# Patient Record
Sex: Male | Born: 1990
Health system: Southern US, Community
[De-identification: ages and names within clinical notes are randomized; demographics above are authoritative.]

## PROBLEM LIST (undated history)

## (undated) DIAGNOSIS — E119 Type 2 diabetes mellitus without complications: Secondary | ICD-10-CM

## (undated) DIAGNOSIS — K219 Gastro-esophageal reflux disease without esophagitis: Secondary | ICD-10-CM

## (undated) DIAGNOSIS — Z9889 Other specified postprocedural states: Secondary | ICD-10-CM

## (undated) DIAGNOSIS — Z87442 Personal history of urinary calculi: Secondary | ICD-10-CM

## (undated) DIAGNOSIS — R112 Nausea with vomiting, unspecified: Secondary | ICD-10-CM

## (undated) DIAGNOSIS — J45909 Unspecified asthma, uncomplicated: Secondary | ICD-10-CM

## (undated) HISTORY — PX: APPENDECTOMY: SHX54

---

## 2013-10-10 ENCOUNTER — Other Ambulatory Visit (HOSPITAL_COMMUNITY)
Admission: RE | Admit: 2013-10-10 | Discharge: 2013-10-10 | Disposition: A | Discharge status: Home / Self Care | Payer: BC Managed Care – PPO | Source: Ambulatory Visit | Attending: Family Medicine | Admitting: Family Medicine

## 2013-10-10 ENCOUNTER — Encounter (HOSPITAL_COMMUNITY): Payer: Self-pay | Admitting: Emergency Medicine

## 2013-10-10 ENCOUNTER — Emergency Department (INDEPENDENT_AMBULATORY_CARE_PROVIDER_SITE_OTHER)
Admission: EM | Admit: 2013-10-10 | Discharge: 2013-10-10 | Disposition: A | Discharge status: Home / Self Care | Payer: BC Managed Care – PPO | Source: Home / Self Care | Attending: Family Medicine | Admitting: Family Medicine

## 2013-10-10 DIAGNOSIS — Z113 Encounter for screening for infections with a predominantly sexual mode of transmission: Secondary | ICD-10-CM | POA: Insufficient documentation

## 2013-10-10 DIAGNOSIS — N3090 Cystitis, unspecified without hematuria: Secondary | ICD-10-CM

## 2013-10-10 DIAGNOSIS — N342 Other urethritis: Secondary | ICD-10-CM

## 2013-10-10 HISTORY — DX: Unspecified asthma, uncomplicated: J45.909

## 2013-10-10 LAB — URINE MICROSCOPIC-ADD ON

## 2013-10-10 LAB — URINALYSIS, ROUTINE W REFLEX MICROSCOPIC
Bilirubin Urine: NEGATIVE
GLUCOSE, UA: NEGATIVE mg/dL
KETONES UR: NEGATIVE mg/dL
Nitrite: NEGATIVE
PH: 6.5 (ref 5.0–8.0)
Protein, ur: 30 mg/dL — AB
Specific Gravity, Urine: 1.03 (ref 1.005–1.030)
Urobilinogen, UA: 1 mg/dL (ref 0.0–1.0)

## 2013-10-10 LAB — POCT URINALYSIS DIP (DEVICE)
BILIRUBIN URINE: NEGATIVE
Glucose, UA: NEGATIVE mg/dL
Ketones, ur: NEGATIVE mg/dL
Leukocytes, UA: NEGATIVE
NITRITE: NEGATIVE
PH: 6.5 (ref 5.0–8.0)
Protein, ur: 30 mg/dL — AB
Specific Gravity, Urine: 1.025 (ref 1.005–1.030)
Urobilinogen, UA: 0.2 mg/dL (ref 0.0–1.0)

## 2013-10-10 MED ORDER — CIPROFLOXACIN HCL 500 MG PO TABS: 500.0000 mg | ORAL_TABLET | Freq: Two times a day (BID) | ORAL | Status: DC

## 2013-10-10 MED ORDER — TRAMADOL HCL 50 MG PO TABS: 50.0000 mg | ORAL_TABLET | Freq: Four times a day (QID) | ORAL | Status: DC | PRN

## 2013-10-10 MED ORDER — ACETAMINOPHEN 325 MG PO TABS
ORAL_TABLET | ORAL | Status: AC
  Filled 2013-10-10: qty 3

## 2013-10-10 MED ORDER — ACETAMINOPHEN 500 MG PO TABS
1000.0000 mg | ORAL_TABLET | Freq: Once | ORAL | Status: AC
  Administered 2013-10-10: 1000 mg via ORAL

## 2013-10-10 NOTE — Discharge Instructions (Signed)
Urethritis, Adult  Urethritis is an inflammation of the tube through which urine exits your bladder (urethra).   CAUSES  Urethritis is often caused by an infection in your urethra. The infection can be viral, like herpes. The infection can also be bacterial, like gonorrhea.  RISK FACTORS  Risk factors of urethritis include:  · Having sex without using a condom.  · Having multiple sexual partners.  · Having poor hygiene.  SIGNS AND SYMPTOMS  Symptoms of urethritis are less noticeable in women than in men. These symptoms include:  · Burning feeling when you urinate (dysuria).  · Discharge from your urethra.  · Blood in your urine (hematuria).  · Urinating more than usual.  DIAGNOSIS   To confirm a diagnosis of urethritis, your health care provider will do the following:  · Ask about your sexual history.  · Perform a physical exam.  · Have you provide a sample of your urine for lab testing.  · Use a cotton swab to gently collect a sample from your urethra for lab testing.  TREATMENT   It is important to treat urethritis. Depending on the cause, untreated urethritis may lead to serious genital infections and possibly infertility. Urethritis caused by a bacterial infection is treated with antibiotics. All sexual partners must be treated.   HOME CARE INSTRUCTIONS  · Do not have sex until the test results are known and treatment is completed, even if your symptoms go away before you finish treatment.  · Finish all medicines that you are prescribed.  SEEK MEDICAL CARE IF:   · Your symptoms are not improved in 3 days.  · Your symptoms are getting worse.  · You develop abdominal pain or pelvic pain (in women).  · You develop joint pain.  SEEK IMMEDIATE MEDICAL CARE IF:   · You have a fever with a temperature of 101.8°F (38.8°C) or greater.  · You have severe pain in the belly, back, or side.  · You have repeated vomiting.  Document Released: 01/08/2001 Document Revised: 05/05/2013 Document Reviewed: 03/15/2013  ExitCare®  Patient Information ©2014 [employer].

## 2013-10-10 NOTE — ED Notes (Signed)
C/o  Dark colored urine.  Pain in bladder.  Sudden on set this a.m.  States"I have been drinking a lot of tea over the past week".

## 2013-10-10 NOTE — ED Provider Notes (Signed)
Medical screening examination/treatment/procedure(s) were performed by resident physician or non-physician practitioner and as supervising physician I was immediately available for consultation/collaboration.   KINDL,JAMES DOUGLAS MD.   James D Kindl, MD 10/10/13 1454 

## 2013-10-10 NOTE — ED Provider Notes (Signed)
CSN: [id number]     Arrival date & time 10/10/13  1114 History   First MD Initiated Contact with Patient 10/10/13 1206     Chief Complaint  Patient presents with  . Cystitis   (Consider location/radiation/quality/duration/timing/severity/associated sxs/prior Treatment) HPI Comments: 23 year old male presents complaining of lower abdominal and bladder pain. He says that about 2 weeks ago, he had some fairly severe back pain in his lower back. This has gotten better. 2 days ago, he started to have pain in his lower abdomen, base of his penis, along with urinary frequency, urgency, and pain in the base of the penis with urination. He has a history of a similar problem about 5 years ago, he was found to have "crystals in my urine" and was told this was from drinking tea, and was advised to stop drinking so much tea. Over the past week, he states he does have a lot of tea to drink anything since he may have a recurrence of his previous problem. No fever, chills, NVD. He is not currently sexually active. No history of kidney stones. he has a history of an appendectomy when he was very young.   Past Medical History  Diagnosis Date  . Asthma    Past Surgical History  Procedure Laterality Date  . Appendectomy     History reviewed. No pertinent family history. History  Substance Use Topics  . Smoking status: Never Smoker   . Smokeless tobacco: Not on file  . Alcohol Use: No    Review of Systems  Constitutional: Negative for fever, chills and fatigue.  HENT: Negative for sore throat.   Eyes: Negative for visual disturbance.  Respiratory: Negative for cough and shortness of breath.   Cardiovascular: Negative for chest pain, palpitations and leg swelling.  Gastrointestinal: Positive for abdominal pain. Negative for nausea, vomiting, diarrhea and constipation.  Genitourinary: Positive for dysuria, urgency, frequency and penile pain. Negative for hematuria, flank pain, discharge, penile swelling,  scrotal swelling, genital sores and testicular pain.  Musculoskeletal: Negative for arthralgias, myalgias, neck pain and neck stiffness.  Skin: Negative for rash.  Neurological: Negative for dizziness, weakness and light-headedness.    Allergies  Nsaids  Home Medications   Current Outpatient Rx  Name  Route  Sig  Dispense  Refill  . ciprofloxacin (CIPRO) 500 MG tablet   Oral   Take 1 tablet (500 mg total) by mouth every 12 (twelve) hours.   20 tablet   0   . traMADol (ULTRAM) 50 MG tablet   Oral   Take 1 tablet (50 mg total) by mouth every 6 (six) hours as needed.   15 tablet   0    BP 146/76  Pulse 76  Temp(Src) 98 F (36.7 C) (Oral)  Resp 18  SpO2 100% Physical Exam  Nursing note and vitals reviewed. Constitutional: He is oriented to person, place, and time. He appears well-developed and well-nourished. No distress.  Morbidly obese habitus   HENT:  Head: Normocephalic.  Pulmonary/Chest: Effort normal. No respiratory distress.  Abdominal: Soft. He exhibits no mass. There is no tenderness. There is no rebound, no guarding and no CVA tenderness. No hernia. Hernia confirmed negative in the right inguinal area and confirmed negative in the left inguinal area.  Genitourinary: Rectum normal, testes normal and penis normal.  Unable to palpate prostate due to body habitus  Lymphadenopathy:       Right: No inguinal adenopathy present.       Left: No inguinal adenopathy present.  Neurological: He is alert and oriented to person, place, and time. Coordination normal.  Skin: Skin is warm and dry. No rash noted. He is not diaphoretic.  Psychiatric: He has a normal mood and affect. Judgment normal.    ED Course  Procedures (including critical care time) Labs Review Labs Reviewed  URINALYSIS, ROUTINE W REFLEX MICROSCOPIC - Abnormal; Notable for the following:    APPearance CLOUDY (*)    Hgb urine dipstick LARGE (*)    Protein, ur 30 (*)    Leukocytes, UA TRACE (*)    All  other components within normal limits  POCT URINALYSIS DIP (DEVICE) - Abnormal; Notable for the following:    Hgb urine dipstick LARGE (*)    Protein, ur 30 (*)    All other components within normal limits  URINE CULTURE  URINE MICROSCOPIC-ADD ON  URINE CYTOLOGY ANCILLARY ONLY   Imaging Review No results found.   MDM   1. Cystitis   2. Urethritis    Patient is not sexually active, will treat with Cipro. Culture and cytology sent.  F/u in 3-5 days if no improvement.  Will strain urine as well.     Meds ordered this encounter  Medications  . acetaminophen (TYLENOL) tablet 1,000 mg    Sig:   . ciprofloxacin (CIPRO) 500 MG tablet    Sig: Take 1 tablet (500 mg total) by mouth every 12 (twelve) hours.    Dispense:  20 tablet    Refill:  0    Order Specific Question:  Supervising Provider    Answer:  Linna Hoff [phone]  . traMADol (ULTRAM) 50 MG tablet    Sig: Take 1 tablet (50 mg total) by mouth every 6 (six) hours as needed.    Dispense:  15 tablet    Refill:  0    Order Specific Question:  Supervising Provider    Answer:  Bradd Canary D [[id number]]       Graylon Good, PA-C 10/10/13 1354

## 2013-10-11 LAB — URINE CYTOLOGY ANCILLARY ONLY
CHLAMYDIA, DNA PROBE: NEGATIVE
Neisseria Gonorrhea: NEGATIVE
TRICH (WINDOWPATH): NEGATIVE

## 2013-10-11 LAB — URINE CULTURE
CULTURE: NO GROWTH
Colony Count: NO GROWTH

## 2013-10-12 NOTE — ED Notes (Signed)
Notified patient that his note would be at front desk.

## 2013-10-20 ENCOUNTER — Emergency Department (INDEPENDENT_AMBULATORY_CARE_PROVIDER_SITE_OTHER)
Admission: EM | Admit: 2013-10-20 | Discharge: 2013-10-20 | Disposition: A | Discharge status: Home / Self Care | Payer: BC Managed Care – PPO | Source: Home / Self Care | Attending: Family Medicine | Admitting: Family Medicine

## 2013-10-20 DIAGNOSIS — N478 Other disorders of prepuce: Secondary | ICD-10-CM

## 2013-10-20 DIAGNOSIS — R300 Dysuria: Secondary | ICD-10-CM

## 2013-10-20 DIAGNOSIS — N471 Phimosis: Secondary | ICD-10-CM

## 2013-10-20 LAB — POCT URINALYSIS DIP (DEVICE)
Bilirubin Urine: NEGATIVE
Glucose, UA: NEGATIVE mg/dL
Ketones, ur: NEGATIVE mg/dL
Leukocytes, UA: NEGATIVE
NITRITE: NEGATIVE
PH: 6 (ref 5.0–8.0)
Protein, ur: NEGATIVE mg/dL
Specific Gravity, Urine: >=1.03 (ref 1.005–1.030)
UROBILINOGEN UA: 0.2 mg/dL (ref 0.0–1.0)

## 2013-10-20 MED ORDER — DOXYCYCLINE HYCLATE 100 MG PO CAPS: 100.0000 mg | ORAL_CAPSULE | Freq: Two times a day (BID) | ORAL | Status: AC

## 2013-10-20 NOTE — ED Notes (Addendum)
C/o painful urination States he was seen here for the problem last sunday States he received antibiotics which he has finished He is still having pain when urinating  Urine is discolored Not sexually active

## 2013-10-20 NOTE — Discharge Instructions (Signed)
Thank you for coming in today. Followup with urology Take doxycycline twice daily Come back as needed  Phimosis You or your child has been diagnosed as having phimosis. Phimosis is a tightening (constricting) of the foreskin over the head of the penis. In an uncircumcised male, the foreskin may be so tight that it cannot be easily pulled back over the head of the penis. This is common in young boys (up to 21 years old), but may occur at any age. As long as the child can pass urine, no treatment is needed immediately. This condition should improve by itself as he gets older. It may follow infection or injury, or occur from poor cleaning under the foreskin. Your caregiver may recommend circumcision (removal of part of the foreskin). These are individual preferences which can be decided upon between you and your caregiver. HOME CARE INSTRUCTIONS   Do not try to force back the foreskin. This may cause scarring and make the condition worse.  Clean under the foreskin regularly.  In uncircumcised babies, the foreskin is normally tight. It usually does not start to loosen enough to pull back until the baby is at least 10418 months old. Until then, treat as your caregiver directs. Later, you may gently pull back the foreskin during bathing to wash the penis. SEEK MEDICAL CARE IF:   There is redness, swelling, or drainage from the foreskin. These are signs of infection.  You or your child has pain when passing urine.  An unexplained oral temperature above 102 F (38.9 C) develops. SEEK IMMEDIATE MEDICAL CARE IF:  Your child has not passed urine in 24 hours.  An unexplained oral temperature above 102 F (38.9 C) develops, not controlled by medication. Document Released: 07/12/2000 Document Revised: 10/07/2011 Document Reviewed: 12/07/2008 [facility] Patient Information 2014 [place], [place].

## 2013-10-20 NOTE — ED Provider Notes (Signed)
Sean Williams is a 23 y.o. male who presents to Urgent Care today for pain with urination. This is been present for about one week. Patient notes lower pelvic pain worsened after urinating. He denies any fevers chills nausea vomiting or diarrhea. No radiating pain. No weakness or numbness. He feels well otherwise. He was seen in the urgent care recently diagnosed with a UTI and prescribed Cipro. Urine culture was negative as was urine cytology. He has used a strainer has not seen any kidney stones. He notes he has trouble retracting his foreskin when not erect.    Past Medical History  Diagnosis Date  . Asthma    History  Substance Use Topics  . Smoking status: Never Smoker   . Smokeless tobacco: Not on file  . Alcohol Use: No   ROS as above Medications: No current facility-administered medications for this encounter.   Current Outpatient Prescriptions  Medication Sig Dispense Refill  . doxycycline (VIBRAMYCIN) 100 MG capsule Take 1 capsule (100 mg total) by mouth 2 (two) times daily.  20 capsule  0    Exam:  BP 139/98  Pulse 91  Temp(Src) 98.3 F (36.8 C) (Oral)  Resp 16  SpO2 98% Gen: Well NAD obese HEENT: EOMI,  MMM Lungs: Normal work of breathing. CTABL Heart: RRR no MRG Abd: NABS, Soft. NT, ND Exts: Brisk capillary refill, warm and well perfused.  Genital: Uncircumcised penis. Unable to retract foreskin. Testicles are descended bilaterally and nontender  Results for orders placed during the hospital encounter of 10/20/13 (from the past 24 hour(s))  POCT URINALYSIS DIP (DEVICE)     Status: Abnormal   Collection Time    10/20/13 12:26 PM      Result Value Ref Range   Glucose, UA NEGATIVE  NEGATIVE mg/dL   Bilirubin Urine NEGATIVE  NEGATIVE   Ketones, ur NEGATIVE  NEGATIVE mg/dL   Specific Gravity, Urine >=1.030  1.005 - 1.030   Hgb urine dipstick MODERATE (*) NEGATIVE   pH 6.0  5.0 - 8.0   Protein, ur NEGATIVE  NEGATIVE mg/dL   Urobilinogen, UA 0.2  0.0 - 1.0  mg/dL   Nitrite NEGATIVE  NEGATIVE   Leukocytes, UA NEGATIVE  NEGATIVE   No results found.  Assessment and Plan: 23 y.o. male with urethritis versus possible kidney stone. I suspect urethritis is more likely. Unclear etiology. Patient has a phimosis and this may be contributing to his symptoms. Refer to urology. Will attempt to treat with doxycycline. Followup as needed.  Discussed warning signs or symptoms. Please see discharge instructions. Patient expresses understanding.    Rodolph BongEvan S Corey, MD 10/20/13 1314

## 2018-05-19 ENCOUNTER — Other Ambulatory Visit: Payer: Self-pay | Admitting: Nurse Practitioner

## 2018-05-19 ENCOUNTER — Ambulatory Visit
Admission: RE | Admit: 2018-05-19 | Discharge: 2018-05-19 | Disposition: A | Discharge status: Home / Self Care | Payer: 59 | Source: Ambulatory Visit | Attending: Nurse Practitioner | Admitting: Nurse Practitioner

## 2018-05-19 DIAGNOSIS — M5442 Lumbago with sciatica, left side: Secondary | ICD-10-CM

## 2018-12-09 DIAGNOSIS — L723 Sebaceous cyst: Secondary | ICD-10-CM | POA: Diagnosis not present

## 2018-12-11 DIAGNOSIS — Z79899 Other long term (current) drug therapy: Secondary | ICD-10-CM | POA: Diagnosis not present

## 2018-12-11 DIAGNOSIS — F902 Attention-deficit hyperactivity disorder, combined type: Secondary | ICD-10-CM | POA: Diagnosis not present

## 2018-12-14 DIAGNOSIS — L02212 Cutaneous abscess of back [any part, except buttock]: Secondary | ICD-10-CM | POA: Diagnosis not present

## 2019-01-21 DIAGNOSIS — R350 Frequency of micturition: Secondary | ICD-10-CM | POA: Diagnosis not present

## 2019-01-21 DIAGNOSIS — R319 Hematuria, unspecified: Secondary | ICD-10-CM | POA: Diagnosis not present

## 2019-01-23 ENCOUNTER — Other Ambulatory Visit: Payer: Self-pay

## 2019-01-23 ENCOUNTER — Emergency Department (HOSPITAL_COMMUNITY)
Admission: EM | Admit: 2019-01-23 | Discharge: 2019-01-23 | Disposition: A | Discharge status: Home / Self Care | Payer: BC Managed Care – PPO | Attending: Emergency Medicine | Admitting: Emergency Medicine

## 2019-01-23 ENCOUNTER — Encounter (HOSPITAL_COMMUNITY): Payer: Self-pay | Admitting: *Deleted

## 2019-01-23 ENCOUNTER — Emergency Department (HOSPITAL_COMMUNITY): Payer: BC Managed Care – PPO

## 2019-01-23 DIAGNOSIS — J45909 Unspecified asthma, uncomplicated: Secondary | ICD-10-CM | POA: Insufficient documentation

## 2019-01-23 DIAGNOSIS — R319 Hematuria, unspecified: Secondary | ICD-10-CM | POA: Diagnosis not present

## 2019-01-23 DIAGNOSIS — N201 Calculus of ureter: Secondary | ICD-10-CM | POA: Insufficient documentation

## 2019-01-23 DIAGNOSIS — N1330 Unspecified hydronephrosis: Secondary | ICD-10-CM | POA: Diagnosis not present

## 2019-01-23 DIAGNOSIS — Z79899 Other long term (current) drug therapy: Secondary | ICD-10-CM | POA: Diagnosis not present

## 2019-01-23 DIAGNOSIS — R300 Dysuria: Secondary | ICD-10-CM | POA: Diagnosis present

## 2019-01-23 LAB — URINALYSIS, ROUTINE W REFLEX MICROSCOPIC
Bilirubin Urine: NEGATIVE
Glucose, UA: NEGATIVE mg/dL
Ketones, ur: NEGATIVE mg/dL
Leukocytes,Ua: NEGATIVE
Nitrite: NEGATIVE
Protein, ur: 30 mg/dL — AB
RBC / HPF: >50 RBC/hpf — ABNORMAL HIGH (ref 0–5)
Specific Gravity, Urine: 1.026 (ref 1.005–1.030)
pH: 5 (ref 5.0–8.0)

## 2019-01-23 MED ORDER — ACETAMINOPHEN 500 MG PO TABS
1000.0000 mg | ORAL_TABLET | Freq: Once | ORAL | Status: AC
  Administered 2019-01-23: 1000 mg via ORAL
  Filled 2019-01-23: qty 2

## 2019-01-23 MED ORDER — HYDROCODONE-ACETAMINOPHEN 5-325 MG PO TABS: 1.0000 | ORAL_TABLET | ORAL | 0 refills | Status: DC | PRN

## 2019-01-23 NOTE — ED Provider Notes (Signed)
[facility]   CSN: [id number] Arrival date & time: 01/23/19  0204    History   Chief Complaint Chief Complaint  Patient presents with  . Nephrolithiasis    HPI Sean Williams is a 28 y.o. male with history of asthma who presents to the emergency department with a chief complaint of dysuria.   The patient reports suprapubic abdominal pain that radiated into the testicles accompanied by dysuria, urinary frequency and hesitancy that began 2 days ago.    He was seen at urgent care and a urinalysis was performed that showed blood in his urine.  He reports that he was having nausea 2 days ago, which has since resolved.  No imaging was performed, and he was discharged to home. He reports that it seemed as if his symptoms were initially improving until last night when the pain significantly worsened.  He also feels as if he began to have back and flank pain when his symptoms worsen last night.  No treatment prior to arrival.  He denies fever, chills, vomiting, diarrhea, constipation, hematuria, shortness of breath, chest pain, penile discharge, or penile or testicular pain or swelling.  The patient reports an anaphylactic allergy to NSAIDs.  He reports that he is previously been told that he may have kidney stone several times, but does not think that he has ever actually passed a stone.  Not currently sexually active.     The history is provided by the patient. No language interpreter was used.    Past Medical History:  Diagnosis Date  . Asthma     There are no active problems to display for this patient.   Past Surgical History:  Procedure Laterality Date  . APPENDECTOMY          Home Medications    Prior to Admission medications   Medication Sig Start Date End Date Taking? Authorizing Provider  atorvastatin (LIPITOR) 10 MG tablet Take 10 mg by mouth every evening. 12/25/18  Yes [provider]  metFORMIN  (GLUCOPHAGE) 500 MG tablet Take 500 mg by mouth 2 (two) times daily with a meal.   Yes [provider]  omeprazole (PRILOSEC) 20 MG capsule Take 20 mg by mouth 2 (two) times a day. 12/25/18  Yes [provider]  tamsulosin (FLOMAX) 0.4 MG CAPS capsule Take 0.4 mg by mouth 2 (two) times a day. 01/21/19  Yes [provider]  VYVANSE 10 MG capsule Take 10 mg by mouth daily. 12/15/18  Yes [provider]  doxycycline (VIBRAMYCIN) 100 MG capsule Take 1 capsule (100 mg total) by mouth 2 (two) times daily. Patient not taking: Reported on 01/23/2019 10/20/13   Rodolph Bongorey, Evan S, MD    Family History No family history on file.  Social History Social History   Tobacco Use  . Smoking status: Never Smoker  Substance Use Topics  . Alcohol use: No  . Drug use: Yes    Types: Marijuana     Allergies   Nsaids   Review of Systems Review of Systems  Constitutional: Negative for appetite change and fever.  HENT: Negative for congestion and sore throat.   Respiratory: Negative for cough, shortness of breath and wheezing.   Cardiovascular: Negative for chest pain.  Gastrointestinal: Positive for abdominal pain. Negative for diarrhea, nausea and vomiting.  Genitourinary: Positive for flank pain, frequency and urgency. Negative for discharge, dysuria, hematuria, penile pain, penile swelling, scrotal swelling and testicular pain.  Musculoskeletal: Positive for  back pain. Negative for arthralgias, myalgias, neck pain and neck stiffness.  Skin: Negative for rash.  Allergic/Immunologic: Negative for immunocompromised state.  Neurological: Negative for syncope, weakness and headaches.  Psychiatric/Behavioral: Negative for confusion.     Physical Exam Updated Vital Signs BP (!) 142/91 (BP Location: Right Arm)   Pulse (!) 106   Temp 98.2 F (36.8 C) (Oral)   Resp 16   SpO2 98%   Physical Exam Vitals signs and nursing note reviewed.  Constitutional:      Appearance:  He is well-developed. He is obese.  HENT:     Head: Normocephalic.  Eyes:     Conjunctiva/sclera: Conjunctivae normal.  Neck:     Musculoskeletal: Neck supple.  Cardiovascular:     Rate and Rhythm: Regular rhythm. Tachycardia present.     Heart sounds: No murmur.  Pulmonary:     Effort: Pulmonary effort is normal. No respiratory distress.     Breath sounds: No stridor. No wheezing, rhonchi or rales.  Chest:     Chest wall: No tenderness.  Abdominal:     General: There is no distension.     Palpations: Abdomen is soft. There is no mass.     Tenderness: There is abdominal tenderness. There is no right CVA tenderness, left CVA tenderness, guarding or rebound.     Hernia: No hernia is present.     Comments: Tender to palpation over the suprapubic region without rebound or guarding.  No CVA tenderness bilaterally.  Normoactive bowel sounds.  Abdomen is otherwise soft and nontender.  Skin:    General: Skin is warm and dry.  Neurological:     Mental Status: He is alert.  Psychiatric:        Behavior: Behavior normal.      ED Treatments / Results  Labs (all labs ordered are listed, but only abnormal results are displayed) Labs Reviewed  URINALYSIS, ROUTINE W REFLEX MICROSCOPIC - Abnormal; Notable for the following components:      Result Value   APPearance HAZY (*)    Hgb urine dipstick LARGE (*)    Protein, ur 30 (*)    RBC / HPF >50 (*)    Bacteria, UA RARE (*)    All other components within normal limits  URINE CULTURE    EKG None  Radiology No results found.  Procedures Procedures (including critical care time)  Medications Ordered in ED Medications  acetaminophen (TYLENOL) tablet 1,000 mg (1,000 mg Oral Given 01/23/19 0650)     Initial Impression / Assessment and Plan / ED Course  I have reviewed the triage vital signs and the nursing notes.  Pertinent labs & imaging results that were available during my care of the patient were reviewed by me and  considered in my medical decision making (see chart for details).        28 year old male with history of asthma presenting with suprapubic pain and urinary complaints for the last 2 days.  No constitutional symptoms.  He has an anaphylactic allergy to NSAIDs, but has not taken anything for his symptoms prior to arrival.  He is hemodynamically stable with mild tachycardia in the ER.  UA with hemoglobinuria and RBCs.  Given that the patient is not failed conservative management, had a shared decision making conversation and he is agreeable to Tylenol.  Will order CT stone study and reassess.  Urine culture sent.  Patient care transferred to PA. Gekas at the end of my shift. Patient presentation, ED course, and plan  of care discussed with review of all pertinent labs and imaging. Please see his/her note for further details regarding further ED course and disposition.   Final Clinical Impressions(s) / ED Diagnoses   Final diagnoses:  None    ED Discharge Orders    None       Joanne Gavel, PA-C 01/23/19 0708    Ezequiel Essex, MD 01/23/19 [id number]

## 2019-01-23 NOTE — ED Triage Notes (Signed)
Pt was seen at [facility] a few days ago, had blood in his urine, and dx with kidney stones. Reports pain started again around 10 pm and is now having difficulty urinating.

## 2019-01-23 NOTE — Discharge Instructions (Signed)
Take Norco as prescribed for severe pain. Otherwise take Tylenol for mild-moderate pain Strain the urine Drink plenty of fluids Follow up with urology or return if worsening

## 2019-01-23 NOTE — ED Notes (Signed)
States he felt better today however around 1030 tonight he started feeling like something sharp in the base of his penis.

## 2019-01-24 LAB — URINE CULTURE
Culture: NO GROWTH
Special Requests: NORMAL

## 2019-01-26 DIAGNOSIS — E119 Type 2 diabetes mellitus without complications: Secondary | ICD-10-CM | POA: Diagnosis not present

## 2019-01-26 DIAGNOSIS — N200 Calculus of kidney: Secondary | ICD-10-CM | POA: Diagnosis not present

## 2019-01-26 DIAGNOSIS — E782 Mixed hyperlipidemia: Secondary | ICD-10-CM | POA: Diagnosis not present

## 2019-02-19 DIAGNOSIS — Z713 Dietary counseling and surveillance: Secondary | ICD-10-CM | POA: Diagnosis not present

## 2019-03-10 DIAGNOSIS — Z713 Dietary counseling and surveillance: Secondary | ICD-10-CM | POA: Diagnosis not present

## 2019-03-17 DIAGNOSIS — F902 Attention-deficit hyperactivity disorder, combined type: Secondary | ICD-10-CM | POA: Diagnosis not present

## 2019-03-17 DIAGNOSIS — Z79899 Other long term (current) drug therapy: Secondary | ICD-10-CM | POA: Diagnosis not present

## 2019-04-13 DIAGNOSIS — Z713 Dietary counseling and surveillance: Secondary | ICD-10-CM | POA: Diagnosis not present

## 2019-04-17 DIAGNOSIS — R300 Dysuria: Secondary | ICD-10-CM | POA: Diagnosis not present

## 2019-04-17 DIAGNOSIS — N419 Inflammatory disease of prostate, unspecified: Secondary | ICD-10-CM | POA: Diagnosis not present

## 2019-06-03 DIAGNOSIS — Z713 Dietary counseling and surveillance: Secondary | ICD-10-CM | POA: Diagnosis not present

## 2019-07-17 DIAGNOSIS — Z03818 Encounter for observation for suspected exposure to other biological agents ruled out: Secondary | ICD-10-CM | POA: Diagnosis not present

## 2019-07-27 DIAGNOSIS — Z79899 Other long term (current) drug therapy: Secondary | ICD-10-CM | POA: Diagnosis not present

## 2019-07-27 DIAGNOSIS — F902 Attention-deficit hyperactivity disorder, combined type: Secondary | ICD-10-CM | POA: Diagnosis not present

## 2019-08-11 DIAGNOSIS — Z713 Dietary counseling and surveillance: Secondary | ICD-10-CM | POA: Diagnosis not present

## 2019-09-09 DIAGNOSIS — E782 Mixed hyperlipidemia: Secondary | ICD-10-CM | POA: Diagnosis not present

## 2019-09-09 DIAGNOSIS — E1169 Type 2 diabetes mellitus with other specified complication: Secondary | ICD-10-CM | POA: Diagnosis not present

## 2019-09-09 DIAGNOSIS — K219 Gastro-esophageal reflux disease without esophagitis: Secondary | ICD-10-CM | POA: Diagnosis not present

## 2019-09-09 DIAGNOSIS — Z23 Encounter for immunization: Secondary | ICD-10-CM | POA: Diagnosis not present

## 2019-09-09 DIAGNOSIS — Z0000 Encounter for general adult medical examination without abnormal findings: Secondary | ICD-10-CM | POA: Diagnosis not present

## 2020-04-19 DIAGNOSIS — L723 Sebaceous cyst: Secondary | ICD-10-CM | POA: Diagnosis not present

## 2020-04-26 ENCOUNTER — Ambulatory Visit: Payer: Self-pay | Admitting: Surgery

## 2020-04-26 DIAGNOSIS — L729 Follicular cyst of the skin and subcutaneous tissue, unspecified: Secondary | ICD-10-CM | POA: Diagnosis not present

## 2020-04-26 NOTE — H&P (View-Only) (Signed)
  [facility] Appointment: 04/26/2020 1:40 PM Location: [facility] Patient #: [id number] DOB: 05/14/1991 Single / Language: [place] / Race: White Male  History of Present Illness (Emmalie Haigh A. Fredricka Bonine MD; 04/26/2020 1:48 PM) Patient words: 29 year old gentleman with a history of a cyst on his mid back. This is been present for at least 2 years. He thinks it has increased in size somewhat and occasionally been inflamed. He is currently on antibiotics to treat this. It is painful with anything touches the area. He would like to have this excised.  The patient is a 29 year old male.   Allergies (Chanel Lonni Fix, CMA; 04/26/2020 1:30 PM) NSAIDs Allergies Reconciled  Medication History (Chanel Lonni Fix, CMA; 04/26/2020 1:30 PM) metFORMIN HCl (500MG  Tablet, Oral) Active. ProAir HFA (108 (90 Base)MCG/ACT Aerosol Soln, Inhalation) Active. Atorvastatin Calcium (10MG  Tablet, Oral) Active. Claritin (10MG  Tablet, Oral) Active. Vyvanse (10MG  Capsule, Oral) Active. Omeprazole (20MG  Capsule DR, Oral) Active. Sulfamethoxazole-Trimethoprim (800-160MG  Tablet, Oral) Active. Allegra Allergy (60MG  Tablet, Oral) Active. Medications Reconciled     Review of Systems (Ashtyn Meland A. MD; 04/26/2020 1:48 PM) All other systems negative  Vitals (Chanel Nolan CMA; 04/26/2020 1:31 PM) 04/26/2020 1:30 PM Weight: 301 lb Height: 71in Body Surface Area: 2.51 m Body Mass Index: 41.98 kg/m  Temp.: 97.20F  Pulse: 119 (Regular)  BP: 128/84(Sitting, Left Arm, Standard)        Physical Exam (Gracious Renken A. MD; 04/26/2020 1:48 PM)  The physical exam findings are as follows: Note:Alert and well-appearing. Respirations. In the mid back there is a mobile, fluctuant subcutaneous cyst approximately 5 cm in diameter, no overlying cellulitis or induration    Assessment & Plan (Ruthetta Koopmann A. Fredricka Bonine MD; 04/26/2020 1:49 PM)  SUBCUTANEOUS CYST (L72.9) Story: Chronic and with  intermittent infection, currently on antibiotics. Discussed the technique of excision and risks of bleeding, infection, wound healing problems or open wound requiring packing, cyst recurrence, seroma or hematoma formation. Questions answered he would like to schedule soon as possible.

## 2020-04-26 NOTE — H&P (Signed)
  Sean Williams Appointment: 04/26/2020 1:40 PM Location: [facility] Patient #: [id number] DOB: 02/13/1991 Single / Language: English / Race: White Male  History of Present Illness (Jamesetta Greenhalgh A. Trinty Marken MD; 04/26/2020 1:48 PM) Patient words: 29-year-old gentleman with a history of a cyst on his mid back. This is been present for at least 2 years. He thinks it has increased in size somewhat and occasionally been inflamed. He is currently on antibiotics to treat this. It is painful with anything touches the area. He would like to have this excised.  The patient is a 29 year old male.   Allergies (Chanel Nolan, CMA; 04/26/2020 1:30 PM) NSAIDs Allergies Reconciled  Medication History (Chanel Nolan, CMA; 04/26/2020 1:30 PM) metFORMIN HCl (500MG Tablet, Oral) Active. ProAir HFA (108 (90 Base)MCG/ACT Aerosol Soln, Inhalation) Active. Atorvastatin Calcium (10MG Tablet, Oral) Active. Claritin (10MG Tablet, Oral) Active. Vyvanse (10MG Capsule, Oral) Active. Omeprazole (20MG Capsule DR, Oral) Active. Sulfamethoxazole-Trimethoprim (800-160MG Tablet, Oral) Active. Allegra Allergy (60MG Tablet, Oral) Active. Medications Reconciled     Review of Systems (Calvin Chura A. Equilla Que MD; 04/26/2020 1:48 PM) All other systems negative  Vitals (Chanel Nolan CMA; 04/26/2020 1:31 PM) 04/26/2020 1:30 PM Weight: 301 lb Height: 71in Body Surface Area: 2.51 m Body Mass Index: 41.98 kg/m  Temp.: 97.7F  Pulse: 119 (Regular)  BP: 128/84(Sitting, Left Arm, Standard)        Physical Exam (Emeline Simpson A. Meara Wiechman MD; 04/26/2020 1:48 PM)  The physical exam findings are as follows: Note:Alert and well-appearing. Respirations. In the mid back there is a mobile, fluctuant subcutaneous cyst approximately 5 cm in diameter, no overlying cellulitis or induration    Assessment & Plan (Rosena Bartle A. Millette Halberstam MD; 04/26/2020 1:49 PM)  SUBCUTANEOUS CYST (L72.9) Story: Chronic and with  intermittent infection, currently on antibiotics. Discussed the technique of excision and risks of bleeding, infection, wound healing problems or open wound requiring packing, cyst recurrence, seroma or hematoma formation. Questions answered he would like to schedule soon as possible. 

## 2020-04-27 ENCOUNTER — Encounter (HOSPITAL_BASED_OUTPATIENT_CLINIC_OR_DEPARTMENT_OTHER): Payer: Self-pay | Admitting: Surgery

## 2020-04-27 ENCOUNTER — Other Ambulatory Visit: Payer: Self-pay

## 2020-04-28 ENCOUNTER — Encounter (HOSPITAL_BASED_OUTPATIENT_CLINIC_OR_DEPARTMENT_OTHER)
Admission: RE | Admit: 2020-04-28 | Discharge: 2020-04-28 | Disposition: A | Discharge status: Home / Self Care | Payer: BC Managed Care – PPO | Source: Ambulatory Visit | Attending: Surgery | Admitting: Surgery

## 2020-04-28 ENCOUNTER — Other Ambulatory Visit (HOSPITAL_COMMUNITY)
Admission: RE | Admit: 2020-04-28 | Discharge: 2020-04-28 | Disposition: A | Discharge status: Home / Self Care | Payer: BC Managed Care – PPO | Source: Ambulatory Visit | Attending: Surgery | Admitting: Surgery

## 2020-04-28 DIAGNOSIS — Z20822 Contact with and (suspected) exposure to covid-19: Secondary | ICD-10-CM | POA: Diagnosis not present

## 2020-04-28 DIAGNOSIS — Z01812 Encounter for preprocedural laboratory examination: Secondary | ICD-10-CM | POA: Diagnosis not present

## 2020-04-28 DIAGNOSIS — L720 Epidermal cyst: Secondary | ICD-10-CM | POA: Diagnosis not present

## 2020-04-28 LAB — BASIC METABOLIC PANEL
Anion gap: 11 (ref 5–15)
BUN: 15 mg/dL (ref 6–20)
CO2: 25 mmol/L (ref 22–32)
Calcium: 9.2 mg/dL (ref 8.9–10.3)
Chloride: 102 mmol/L (ref 98–111)
Creatinine, Ser: 0.95 mg/dL (ref 0.61–1.24)
GFR calc Af Amer: >60 mL/min (ref >60)
GFR calc non Af Amer: >60 mL/min (ref >60)
Glucose, Bld: 94 mg/dL (ref 70–99)
Potassium: 4.8 mmol/L (ref 3.5–5.1)
Sodium: 138 mmol/L (ref 135–145)

## 2020-04-28 LAB — SARS CORONAVIRUS 2 (TAT 6-24 HRS): SARS Coronavirus 2: NEGATIVE

## 2020-04-28 NOTE — Progress Notes (Signed)
Surgical soap given with instructions, pt verbalized understanding.  

## 2020-05-01 ENCOUNTER — Ambulatory Visit (HOSPITAL_BASED_OUTPATIENT_CLINIC_OR_DEPARTMENT_OTHER): Payer: BC Managed Care – PPO | Admitting: Anesthesiology

## 2020-05-01 ENCOUNTER — Ambulatory Visit (HOSPITAL_BASED_OUTPATIENT_CLINIC_OR_DEPARTMENT_OTHER)
Admission: RE | Admit: 2020-05-01 | Discharge: 2020-05-01 | Disposition: A | Discharge status: Home / Self Care | Payer: BC Managed Care – PPO | Attending: Surgery | Admitting: Surgery

## 2020-05-01 ENCOUNTER — Encounter (HOSPITAL_BASED_OUTPATIENT_CLINIC_OR_DEPARTMENT_OTHER): Payer: Self-pay | Admitting: Surgery

## 2020-05-01 ENCOUNTER — Other Ambulatory Visit: Payer: Self-pay

## 2020-05-01 ENCOUNTER — Encounter (HOSPITAL_BASED_OUTPATIENT_CLINIC_OR_DEPARTMENT_OTHER)
Admission: RE | Disposition: A | Discharge status: Home / Self Care | Payer: Self-pay | Source: Home / Self Care | Attending: Surgery

## 2020-05-01 DIAGNOSIS — L720 Epidermal cyst: Secondary | ICD-10-CM | POA: Diagnosis not present

## 2020-05-01 DIAGNOSIS — L723 Sebaceous cyst: Secondary | ICD-10-CM | POA: Diagnosis not present

## 2020-05-01 DIAGNOSIS — L729 Follicular cyst of the skin and subcutaneous tissue, unspecified: Secondary | ICD-10-CM | POA: Diagnosis not present

## 2020-05-01 DIAGNOSIS — E119 Type 2 diabetes mellitus without complications: Secondary | ICD-10-CM | POA: Diagnosis not present

## 2020-05-01 DIAGNOSIS — J45909 Unspecified asthma, uncomplicated: Secondary | ICD-10-CM | POA: Diagnosis not present

## 2020-05-01 DIAGNOSIS — K219 Gastro-esophageal reflux disease without esophagitis: Secondary | ICD-10-CM | POA: Diagnosis not present

## 2020-05-01 HISTORY — PX: MASS EXCISION: SHX2000

## 2020-05-01 HISTORY — DX: Gastro-esophageal reflux disease without esophagitis: K21.9

## 2020-05-01 HISTORY — DX: Type 2 diabetes mellitus without complications: E11.9

## 2020-05-01 HISTORY — DX: Personal history of urinary calculi: Z87.442

## 2020-05-01 HISTORY — DX: Nausea with vomiting, unspecified: Z98.890

## 2020-05-01 HISTORY — DX: Nausea with vomiting, unspecified: R11.2

## 2020-05-01 LAB — GLUCOSE, CAPILLARY
Glucose-Capillary: 108 mg/dL — ABNORMAL HIGH (ref 70–99)
Glucose-Capillary: 102 mg/dL — ABNORMAL HIGH (ref 70–99)

## 2020-05-01 SURGERY — EXCISION MASS
Anesthesia: Monitor Anesthesia Care | Site: Back

## 2020-05-01 MED ORDER — PROPOFOL 500 MG/50ML IV EMUL
INTRAVENOUS | Status: DC | PRN
  Administered 2020-05-01: 100 ug/kg/min via INTRAVENOUS

## 2020-05-01 MED ORDER — ACETAMINOPHEN 325 MG RE SUPP: 650.0000 mg | RECTAL | Status: DC | PRN

## 2020-05-01 MED ORDER — EPHEDRINE 5 MG/ML INJ
INTRAVENOUS | Status: AC
  Filled 2020-05-01: qty 10

## 2020-05-01 MED ORDER — OXYCODONE HCL 5 MG PO TABS: 5.0000 mg | ORAL_TABLET | Freq: Once | ORAL | Status: DC | PRN

## 2020-05-01 MED ORDER — BUPIVACAINE HCL (PF) 0.25 % IJ SOLN
INTRAMUSCULAR | Status: DC | PRN
  Administered 2020-05-01: 20 mL

## 2020-05-01 MED ORDER — FENTANYL CITRATE (PF) 100 MCG/2ML IJ SOLN: 25.0000 ug | INTRAMUSCULAR | Status: DC | PRN

## 2020-05-01 MED ORDER — PROPOFOL 10 MG/ML IV BOLUS
INTRAVENOUS | Status: AC
  Filled 2020-05-01: qty 20

## 2020-05-01 MED ORDER — ACETAMINOPHEN 325 MG PO TABS: 650.0000 mg | ORAL_TABLET | ORAL | Status: DC | PRN

## 2020-05-01 MED ORDER — BUPIVACAINE HCL (PF) 0.25 % IJ SOLN
INTRAMUSCULAR | Status: AC
  Filled 2020-05-01: qty 30

## 2020-05-01 MED ORDER — GABAPENTIN 300 MG PO CAPS
300.0000 mg | ORAL_CAPSULE | ORAL | Status: AC
  Administered 2020-05-01: 300 mg via ORAL

## 2020-05-01 MED ORDER — TRAMADOL HCL 50 MG PO TABS: 50.0000 mg | ORAL_TABLET | Freq: Four times a day (QID) | ORAL | 0 refills | Status: AC | PRN

## 2020-05-01 MED ORDER — OXYCODONE HCL 5 MG/5ML PO SOLN: 5.0000 mg | Freq: Once | ORAL | Status: DC | PRN

## 2020-05-01 MED ORDER — PROMETHAZINE HCL 25 MG/ML IJ SOLN: 6.2500 mg | INTRAMUSCULAR | Status: DC | PRN

## 2020-05-01 MED ORDER — PHENYLEPHRINE 40 MCG/ML (10ML) SYRINGE FOR IV PUSH (FOR BLOOD PRESSURE SUPPORT)
PREFILLED_SYRINGE | INTRAVENOUS | Status: AC
  Filled 2020-05-01: qty 10

## 2020-05-01 MED ORDER — LIDOCAINE HCL (PF) 1 % IJ SOLN
INTRAMUSCULAR | Status: AC
  Filled 2020-05-01: qty 30

## 2020-05-01 MED ORDER — ACETAMINOPHEN 500 MG PO TABS
ORAL_TABLET | ORAL | Status: AC
  Filled 2020-05-01: qty 2

## 2020-05-01 MED ORDER — DEXTROSE 5 % IV SOLN
3.0000 g | INTRAVENOUS | Status: AC
  Administered 2020-05-01: 3 g via INTRAVENOUS

## 2020-05-01 MED ORDER — MIDAZOLAM HCL 2 MG/2ML IJ SOLN
INTRAMUSCULAR | Status: AC
  Filled 2020-05-01: qty 2

## 2020-05-01 MED ORDER — GABAPENTIN 300 MG PO CAPS
ORAL_CAPSULE | ORAL | Status: AC
  Filled 2020-05-01: qty 1

## 2020-05-01 MED ORDER — CEFAZOLIN SODIUM-DEXTROSE 2-4 GM/100ML-% IV SOLN
INTRAVENOUS | Status: AC
  Filled 2020-05-01: qty 200

## 2020-05-01 MED ORDER — SODIUM CHLORIDE 0.9% FLUSH: 3.0000 mL | INTRAVENOUS | Status: DC | PRN

## 2020-05-01 MED ORDER — MEPERIDINE HCL 25 MG/ML IJ SOLN: 6.2500 mg | INTRAMUSCULAR | Status: DC | PRN

## 2020-05-01 MED ORDER — BUPIVACAINE LIPOSOME 1.3 % IJ SUSP: 20.0000 mL | Freq: Once | INTRAMUSCULAR | Status: DC

## 2020-05-01 MED ORDER — LIDOCAINE 2% (20 MG/ML) 5 ML SYRINGE
INTRAMUSCULAR | Status: AC
  Filled 2020-05-01: qty 5

## 2020-05-01 MED ORDER — SODIUM CHLORIDE 0.9% FLUSH: 3.0000 mL | Freq: Two times a day (BID) | INTRAVENOUS | Status: DC

## 2020-05-01 MED ORDER — LACTATED RINGERS IV SOLN: INTRAVENOUS | Status: DC | PRN

## 2020-05-01 MED ORDER — OXYCODONE HCL 5 MG PO TABS: 5.0000 mg | ORAL_TABLET | ORAL | Status: DC | PRN

## 2020-05-01 MED ORDER — CHLORHEXIDINE GLUCONATE 4 % EX LIQD: 60.0000 mL | Freq: Once | CUTANEOUS | Status: DC

## 2020-05-01 MED ORDER — FENTANYL CITRATE (PF) 100 MCG/2ML IJ SOLN
INTRAMUSCULAR | Status: AC
  Filled 2020-05-01: qty 2

## 2020-05-01 MED ORDER — MIDAZOLAM HCL 2 MG/2ML IJ SOLN
INTRAMUSCULAR | Status: DC | PRN
  Administered 2020-05-01: 2 mg via INTRAVENOUS

## 2020-05-01 MED ORDER — ACETAMINOPHEN 500 MG PO TABS
1000.0000 mg | ORAL_TABLET | ORAL | Status: AC
  Administered 2020-05-01: 1000 mg via ORAL

## 2020-05-01 MED ORDER — FENTANYL CITRATE (PF) 100 MCG/2ML IJ SOLN
INTRAMUSCULAR | Status: DC | PRN
  Administered 2020-05-01: 50 ug via INTRAVENOUS
  Administered 2020-05-01 (×2): 25 ug via INTRAVENOUS

## 2020-05-01 MED ORDER — SODIUM CHLORIDE 0.9 % IV SOLN: 250.0000 mL | INTRAVENOUS | Status: DC | PRN

## 2020-05-01 SURGICAL SUPPLY — 55 items
ADH SKN CLS APL DERMABOND .7 (GAUZE/BANDAGES/DRESSINGS) ×1
APL PRP STRL LF DISP 70% ISPRP (MISCELLANEOUS) ×1
APL SKNCLS STERI-STRIP NONHPOA (GAUZE/BANDAGES/DRESSINGS) ×1
BENZOIN TINCTURE PRP APPL 2/3 (GAUZE/BANDAGES/DRESSINGS) ×3 IMPLANT
BLADE CLIPPER SURG (BLADE) ×3 IMPLANT
BLADE SURG 15 STRL LF DISP TIS (BLADE) ×1 IMPLANT
BLADE SURG 15 STRL SS (BLADE) ×3
CANISTER SUCT 1200ML W/VALVE (MISCELLANEOUS) IMPLANT
CHLORAPREP W/TINT 26 (MISCELLANEOUS) ×3 IMPLANT
CLOSURE STERI-STRIP 1/2X4 (GAUZE/BANDAGES/DRESSINGS) ×1
CLSR STERI-STRIP ANTIMIC 1/2X4 (GAUZE/BANDAGES/DRESSINGS) ×2 IMPLANT
COVER BACK TABLE 60X90IN (DRAPES) ×3 IMPLANT
COVER MAYO STAND STRL (DRAPES) ×3 IMPLANT
COVER WAND RF STERILE (DRAPES) IMPLANT
DECANTER SPIKE VIAL GLASS SM (MISCELLANEOUS) IMPLANT
DERMABOND ADVANCED (GAUZE/BANDAGES/DRESSINGS) ×2
DERMABOND ADVANCED .7 DNX12 (GAUZE/BANDAGES/DRESSINGS) ×1 IMPLANT
DRAPE LAPAROTOMY 100X72 PEDS (DRAPES) ×3 IMPLANT
DRAPE UTILITY XL STRL (DRAPES) ×3 IMPLANT
DRSG TEGADERM 4X4.75 (GAUZE/BANDAGES/DRESSINGS) IMPLANT
ELECT COATED BLADE 2.86 ST (ELECTRODE) ×3 IMPLANT
ELECT REM PT RETURN 9FT ADLT (ELECTROSURGICAL) ×3
ELECTRODE REM PT RTRN 9FT ADLT (ELECTROSURGICAL) ×1 IMPLANT
GAUZE PACKING IODOFORM 1/4X15 (PACKING) IMPLANT
GAUZE SPONGE 4X4 12PLY STRL (GAUZE/BANDAGES/DRESSINGS) ×3 IMPLANT
GAUZE SPONGE 4X4 12PLY STRL LF (GAUZE/BANDAGES/DRESSINGS) IMPLANT
GLOVE BIO SURGEON STRL SZ 6 (GLOVE) ×6 IMPLANT
GLOVE BIOGEL PI IND STRL 6.5 (GLOVE) ×1 IMPLANT
GLOVE BIOGEL PI INDICATOR 6.5 (GLOVE) ×2
GOWN STRL REUS W/ TWL LRG LVL3 (GOWN DISPOSABLE) ×2 IMPLANT
GOWN STRL REUS W/TWL LRG LVL3 (GOWN DISPOSABLE) ×6
NEEDLE HYPO 25X1 1.5 SAFETY (NEEDLE) ×3 IMPLANT
NS IRRIG 1000ML POUR BTL (IV SOLUTION) IMPLANT
PACK BASIN DAY SURGERY FS (CUSTOM PROCEDURE TRAY) ×3 IMPLANT
PENCIL SMOKE EVACUATOR (MISCELLANEOUS) ×3 IMPLANT
SLEEVE SCD COMPRESS KNEE MED (MISCELLANEOUS) ×3 IMPLANT
SUT ETHILON 2 0 FS 18 (SUTURE) IMPLANT
SUT MNCRL AB 4-0 PS2 18 (SUTURE) ×3 IMPLANT
SUT SILK 2 0 SH (SUTURE) IMPLANT
SUT VIC AB 2-0 SH 27 (SUTURE)
SUT VIC AB 2-0 SH 27XBRD (SUTURE) IMPLANT
SUT VIC AB 3-0 SH 27 (SUTURE) ×3
SUT VIC AB 3-0 SH 27X BRD (SUTURE) ×1 IMPLANT
SUT VICRYL 3-0 CR8 SH (SUTURE) IMPLANT
SUT VICRYL 4-0 PS2 18IN ABS (SUTURE) IMPLANT
SWAB COLLECTION DEVICE MRSA (MISCELLANEOUS) IMPLANT
SWAB CULTURE ESWAB REG 1ML (MISCELLANEOUS) IMPLANT
SYR CONTROL 10ML LL (SYRINGE) ×3 IMPLANT
TAPE HYPAFIX 6 X30' (GAUZE/BANDAGES/DRESSINGS) ×1
TAPE HYPAFIX 6X30 (GAUZE/BANDAGES/DRESSINGS) ×2 IMPLANT
TOWEL GREEN STERILE FF (TOWEL DISPOSABLE) ×3 IMPLANT
TUBE CONNECTING 20'X1/4 (TUBING) ×1
TUBE CONNECTING 20X1/4 (TUBING) ×2 IMPLANT
UNDERPAD 30X36 HEAVY ABSORB (UNDERPADS AND DIAPERS) IMPLANT
YANKAUER SUCT BULB TIP NO VENT (SUCTIONS) ×3 IMPLANT

## 2020-05-01 NOTE — Op Note (Signed)
Operative Note  ADANTE COURINGTON  [id number]  [id number]  05/01/2020   Surgeon: Vikki Ports A ConnorMD  Procedure performed: Excision of 7x6x5cm subcutaneous cyst, mid upper back  Preop diagnosis: Subcutaneous cyst Post-op diagnosis/intraop findings: Same  Specimens: Upper back cyst Retained items: No EBL: Minimal cc Complications: none  Description of procedure: After obtaining informed consent the patient was taken to the operating room and placed prone on operating room table [facility] was initiated, preoperative antibiotics were administered, SCDs applied, and a formal timeout was performed.  The skin of the mid upper back surrounding the lesion was clipped, prepped and draped in usual sterile fashion.  After infiltration with quarter percent Marcaine with epinephrine, an elliptical transverse incision was made and then cautery used to dissect through the skin down to the wall of the cyst.  This was circumferentially freed from the surrounding soft tissue using cautery, ensuring hemostasis as we went.  The cyst was entered during dissection expressing a large amount of curd-like malodorous material consistent with epidermal inclusion cyst.  Entire cyst wall and contents were excised and handed off for pathology.  Hemostasis was ensured in the wound with cautery.  The incision was closed with interrupted deep dermal 3-0 Vicryl and running subcuticular 4-0 Monocryl.  Benzoin, Steri-Strips, and a light pressure dressing were then applied. The patient was then awakened, returned to the supine position and taken to PACU in stable condition.   All counts were correct at the completion of the case.

## 2020-05-01 NOTE — Anesthesia Postprocedure Evaluation (Signed)
Anesthesia Post Note  Patient: Sean Williams  Procedure(s) Performed: EXCISION INFECTED SUBCUTANEOUS CYST MID BACK (N/A Back)     Patient location during evaluation: PACU Anesthesia Type: MAC Level of consciousness: awake and alert Pain management: pain level controlled Vital Signs Assessment: post-procedure vital signs reviewed and stable Respiratory status: spontaneous breathing, nonlabored ventilation and respiratory function stable Cardiovascular status: blood pressure returned to baseline and stable Postop Assessment: no apparent nausea or vomiting Anesthetic complications: no   No complications documented.  Last Vitals:  Vitals:   05/01/20 1130 05/01/20 1152  BP: 117/66 120/82  Pulse: 87 85  Resp: 15 16  Temp:  (!) 36.3 C  SpO2: 100% 100%    Last Pain:  Vitals:   05/01/20 1152  TempSrc:   PainSc: 0-No pain                 Lynda Rainwater

## 2020-05-01 NOTE — Interval H&P Note (Signed)
History and Physical Interval Note:  05/01/2020 9:45 AM  Sean Williams  has presented today for surgery, with the diagnosis of cyst.  The various methods of treatment have been discussed with the patient and family. After consideration of risks, benefits and other options for treatment, the patient has consented to  Procedure(s): EXCISION INFECTED SUBCUTANEOUS CYST MID BACK (N/A) as a surgical intervention.  The patient's history has been reviewed, patient examined, no change in status, stable for surgery.  I have reviewed the patient's chart and labs.  Questions were answered to the patient's satisfaction.     Avielle Imbert Lollie Sails

## 2020-05-01 NOTE — Transfer of Care (Signed)
Immediate Anesthesia Transfer of Care Note  Patient: Cecelia Byars  Procedure(s) Performed: EXCISION INFECTED SUBCUTANEOUS CYST MID BACK (N/A Back)  Patient Location: PACU  Anesthesia Type:MAC  Level of Consciousness: awake, alert , oriented and patient cooperative  Airway & Oxygen Therapy: Patient Spontanous Breathing  Post-op Assessment: Report given to RN and Post -op Vital signs reviewed and stable  Post vital signs: Reviewed and stable  Last Vitals:  Vitals Value Taken Time  BP 117/66 05/01/20 1130  Temp 36.6 C 05/01/20 1112  Pulse 82 05/01/20 1133  Resp 13 05/01/20 1133  SpO2 100 % 05/01/20 1133  Vitals shown include unvalidated device data.  Last Pain:  Vitals:   05/01/20 1130  TempSrc:   PainSc: 0-No pain      Patients Stated Pain Goal: 5 (02/17/56 5051)  Complications: No complications documented.

## 2020-05-01 NOTE — Anesthesia Preprocedure Evaluation (Addendum)
Anesthesia Evaluation  Patient identified by MRN, date of birth, ID band Patient awake    Reviewed: Allergy & Precautions, NPO status , Patient's Chart, lab work & pertinent test results  History of Anesthesia Complications (+) PONV and history of anesthetic complications  Airway Mallampati: III  TM Distance: >3 FB Neck ROM: Full    Dental  (+) Dental Advisory Given, Teeth Intact   Pulmonary asthma ,    Pulmonary exam normal breath sounds clear to auscultation       Cardiovascular negative cardio ROS Normal cardiovascular exam Rhythm:Regular Rate:Normal     Neuro/Psych negative neurological ROS     GI/Hepatic Neg liver ROS, GERD  ,  Endo/Other  diabetesMorbid obesity  Renal/GU negative Renal ROS     Musculoskeletal negative musculoskeletal ROS (+)   Abdominal (+) + obese,   Peds  Hematology negative hematology ROS (+)   Anesthesia Other Findings   Reproductive/Obstetrics                            Anesthesia Physical Anesthesia Plan  ASA: III  Anesthesia Plan: MAC   Post-op Pain Management:    Induction: Intravenous  PONV Risk Score and Plan: 2 and Propofol infusion, Treatment may vary due to age or medical condition, TIVA, Midazolam and Ondansetron  Airway Management Planned: Natural Airway  Additional Equipment: None  Intra-op Plan:   Post-operative Plan:   Informed Consent: I have reviewed the patients History and Physical, chart, labs and discussed the procedure including the risks, benefits and alternatives for the proposed anesthesia with the patient or authorized representative who has indicated his/her understanding and acceptance.     Dental advisory given  Plan Discussed with: CRNA  Anesthesia Plan Comments:        Anesthesia Quick Evaluation

## 2020-05-01 NOTE — Discharge Instructions (Signed)
GENERAL SURGERY: POST OP INSTRUCTIONS  EAT Gradually transition to a high fiber diet with a fiber supplement over the next few weeks after discharge.  Start with a pureed / full liquid diet (see below)  WALK Walk an hour a day.  Control your pain to do that.    CONTROL PAIN Control pain so that you can walk, sleep, tolerate sneezing/coughing, go up/down stairs.  HAVE A BOWEL MOVEMENT DAILY Keep your bowels regular to avoid problems.  OK to try a laxative to override constipation.  OK to use an antidairrheal to slow down diarrhea.  Call if not better after 2 tries  CALL IF YOU HAVE PROBLEMS/CONCERNS Call if you are still struggling despite following these instructions. Call if you have concerns not answered by these instructions  ######################################################################    1. DIET: Follow a light bland diet & liquids the first 24 hours after arrival home, such as soup, liquids, starches, etc.  Be sure to drink plenty of fluids.  Quickly advance to a usual solid diet within a few days.  Avoid fast food or heavy meals as your are more likely to get nauseated or have irregular bowels.  A low-sugar, high-fiber diet for the rest of your life is ideal.   2. Take your usually prescribed home medications unless otherwise directed. 3. PAIN CONTROL: a. Pain is best controlled by a usual combination of three different methods TOGETHER: i. Ice/Heat ii. Over the counter pain medication iii. Prescription pain medication b. Most patients will experience some swelling and bruising around the incisions.  Ice packs or heating pads (30-60 minutes up to 6 times a day) will help. Use ice for the first few days to help decrease swelling and bruising, then switch to heat to help relax tight/sore spots and speed recovery.  Some people prefer to use ice alone, heat alone, alternating between ice & heat.  Experiment to what works for you.  Swelling and bruising can take several weeks  to resolve.   c. It is helpful to take an over-the-counter pain medication regularly for the first few days: i. Naproxen (Aleve, etc)  Two 220mg  tabs twice a day OR Ibuprofen (Advil, etc) Three 200mg  tabs four times a day (every meal & bedtime) AND ii. Acetaminophen (Tylenol, etc) 500-650mg  four times a day (every meal & bedtime) d. A  prescription for pain medication (such as oxycodone, hydrocodone, etc) should be given to you upon discharge.  Take your pain medication as prescribed.  i. If you are having problems/concerns with the prescription medicine (does not control pain, nausea, vomiting, rash, itching, etc), please call [phone] to see if we need to switch you to a different pain medicine that will work better for you and/or control your side effect better. ii. If you need a refill on your pain medication, please contact your pharmacy.  They will contact our office to request authorization. Prescriptions will not be filled after 5 pm or on week-ends. 4. Avoid getting constipated.  Between the surgery and the pain medications, it is common to experience some constipation.  Increasing fluid intake and taking a fiber supplement (such as Metamucil, Citrucel, FiberCon, MiraLax, etc) 1-2 times a day regularly will usually help prevent this problem from occurring.  A mild laxative (prune juice, Milk of Magnesia, MiraLax, etc) should be taken according to package directions if there are no bowel movements after 48 hours.   5. Wash / shower every day starting 2 days after surgery.  You may shower  over the steri strips as they are waterproof.  Continue to shower over incision(s) after the dressing is off. 6. Remove your outer bandage 2 days after surgery. Steri strips will peel off after 1-2 weeks.  You may leave the incision open to air.  You may replace a dressing/Band-Aid to cover the incision for comfort if you wish.   7. ACTIVITIES as tolerated:   a. You may resume regular (light) daily  activities beginning the next day--such as daily self-care, walking, climbing stairs--gradually increasing activities as tolerated.  If you can walk 30 minutes without difficulty, it is safe to try more intense activity such as jogging, treadmill, bicycling, low-impact aerobics, swimming, etc. b. Save the most intensive and strenuous activity for last such as sit-ups, heavy lifting, contact sports, etc  Refrain from any heavy lifting or straining until you are off narcotics for pain control.   c. DO NOT PUSH THROUGH PAIN.  Let pain be your guide: If it hurts to do something, don't do it.  Pain is your body warning you to avoid that activity for another week until the pain goes down. d. You may drive when you are no longer taking prescription pain medication, you can comfortably wear a seatbelt, and you can safely maneuver your car and apply brakes. e. Bonita Quin may have sexual intercourse when it is comfortable.  8. FOLLOW UP in our office a. Please call [facility] at [phone] to set up an appointment to see your surgeon in the office for a follow-up appointment approximately 2-3 weeks after your surgery. b. Make sure that you call for this appointment the day you arrive home to insure a convenient appointment time. 9. IF YOU HAVE DISABILITY OR FAMILY LEAVE FORMS, BRING THEM TO THE OFFICE FOR PROCESSING.  DO NOT GIVE THEM TO YOUR DOCTOR.   WHEN TO CALL us [phone]: 1. Poor pain control 2. Reactions / problems with new medications (rash/itching, nausea, etc)  3. Fever over 101.5 F (38.5 C) 4. Worsening swelling or bruising 5. Continued bleeding from incision. 6. Increased pain, redness, or drainage from the incision 7. Difficulty breathing / swallowing   The clinic staff is available to answer your questions during regular business hours (8:30am-5pm).  Please don't hesitate to call and ask to speak to one of our nurses for clinical concerns.   If you have a medical emergency, go to the nearest  emergency room or call 911.  A surgeon from [facility] is always on call at the [facility], [place] [address], [address], [place], [place]  [address] ? MAIN: [phone] ? TOLL FREE: [phone] ?  FAX [phone] [email or web address]   Post Anesthesia Home Care Instructions  Activity: Get plenty of rest for the remainder of the day. A responsible individual must stay with you for 24 hours following the procedure.  For the next 24 hours, DO NOT: -Drive a car -Advertising copywriter -Drink alcoholic beverages -Take any medication unless instructed by your physician -Make any legal decisions or sign important papers.  Meals: Start with liquid foods such as gelatin or soup. Progress to regular foods as tolerated. Avoid greasy, spicy, heavy foods. If nausea and/or vomiting occur, drink only clear liquids until the nausea and/or vomiting subsides. Call your physician if vomiting continues.  Special Instructions/Symptoms: Your throat may feel dry or sore from the anesthesia or the breathing tube placed in your throat during surgery. If this causes discomfort, gargle with warm salt  water. The discomfort should disappear within 24 hours.  If you had a scopolamine patch placed behind your ear for the management of post- operative nausea and/or vomiting:  1. The medication in the patch is effective for 72 hours, after which it should be removed.  Wrap patch in a tissue and discard in the trash. Wash hands thoroughly with soap and water. 2. You may remove the patch earlier than 72 hours if you experience unpleasant side effects which may include dry mouth, dizziness or visual disturbances. 3. Avoid touching the patch. Wash your hands with soap and water after contact with the patch.

## 2020-05-02 ENCOUNTER — Encounter (HOSPITAL_BASED_OUTPATIENT_CLINIC_OR_DEPARTMENT_OTHER): Payer: Self-pay | Admitting: Surgery

## 2020-05-02 LAB — SURGICAL PATHOLOGY

## 2020-06-13 DIAGNOSIS — F411 Generalized anxiety disorder: Secondary | ICD-10-CM | POA: Diagnosis not present

## 2020-06-13 IMAGING — CT CT RENAL STONE PROTOCOL
2 of 4 series · 17 of 46 positions shown, 19 images · non-contrast
Comparison: None.

CLINICAL DATA: Hematuria, LEFT-sided pain moving to groin for 2
days.

EXAM:
CT ABDOMEN AND PELVIS WITHOUT CONTRAST
TECHNIQUE: Multidetector CT imaging of the abdomen and pelvis was performed
following the standard protocol without IV contrast.

[Series 3: renal stone 5.0 · axial · 0.78mm/px · z∈[+820,+1280]mm · 14 of 102 slices shown, 16 images]
[im 5/102  soft-tissue]
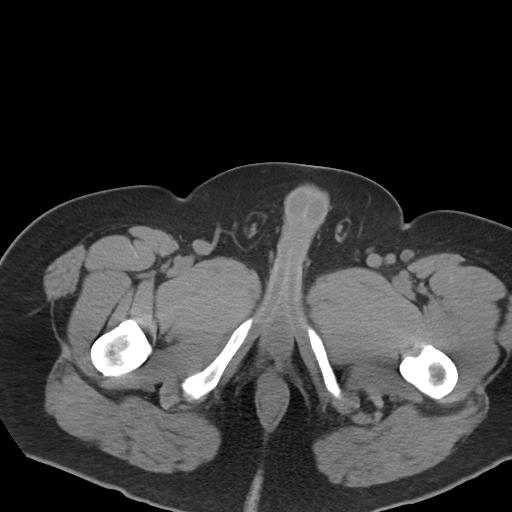
[im 5/102  bone]
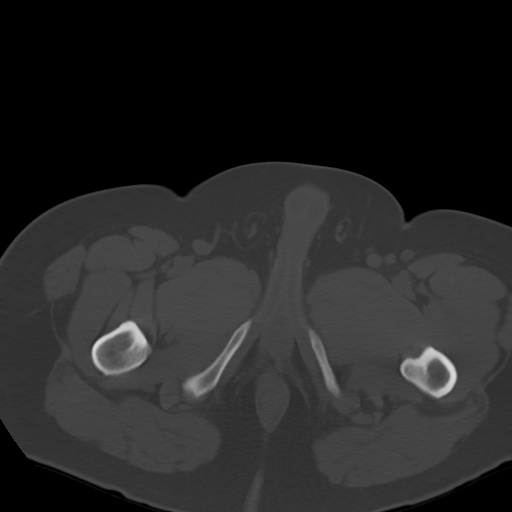
[im 13/102  soft-tissue]
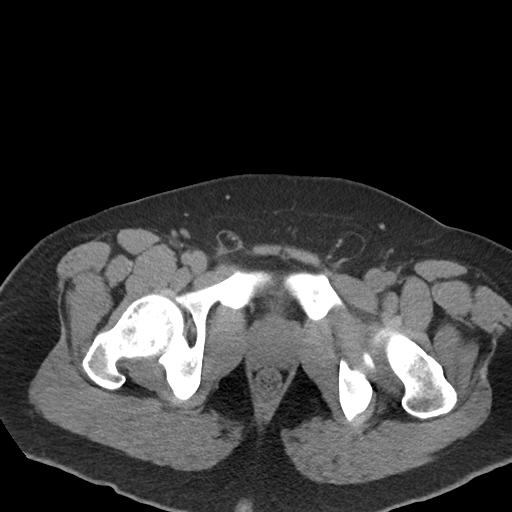
[im 22/102  soft-tissue]
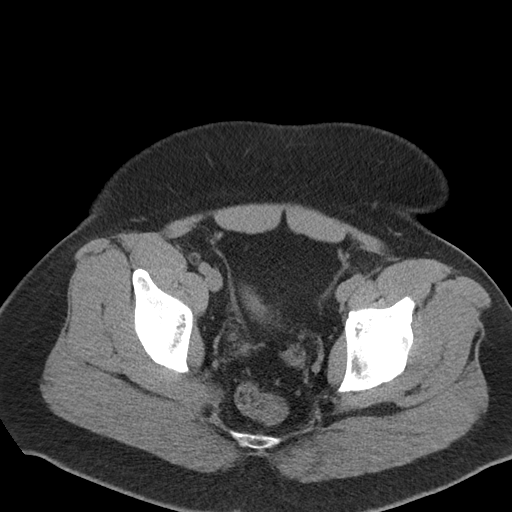
[im 26/102  soft-tissue]
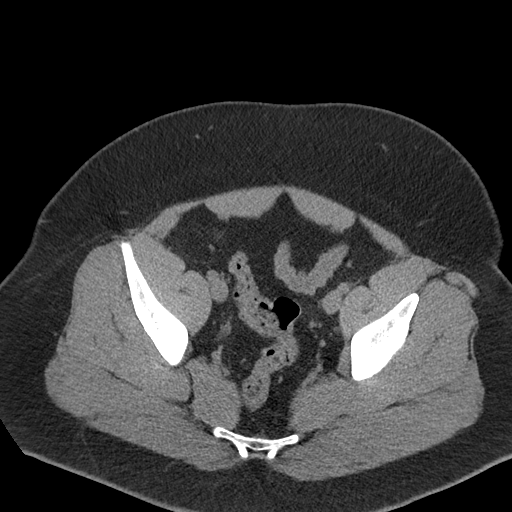
[im 34/102  soft-tissue]
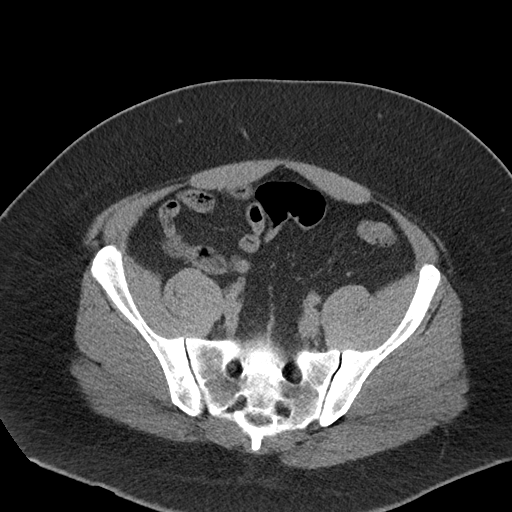
[im 43/102  soft-tissue]
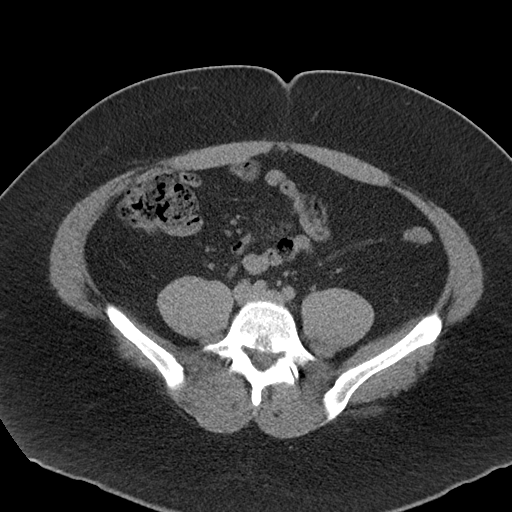
[im 47/102  soft-tissue]
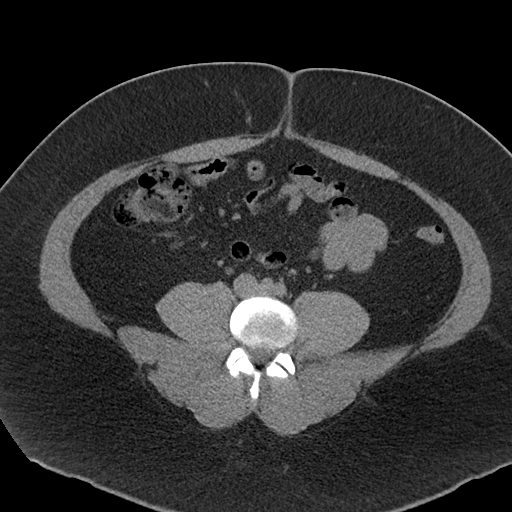
[im 55/102  soft-tissue]
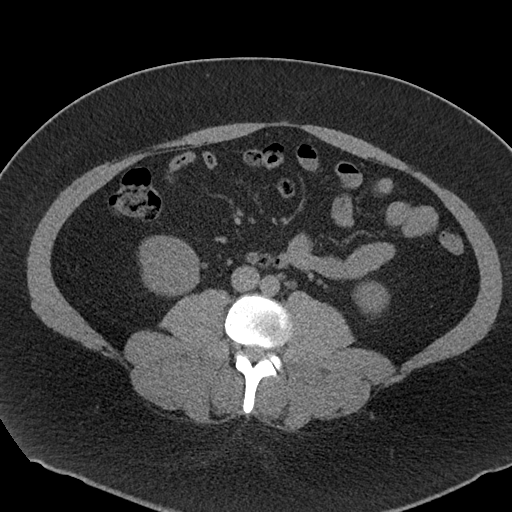
[im 59/102  soft-tissue]
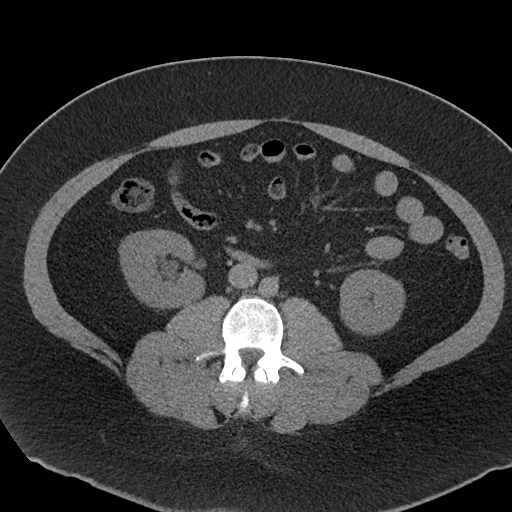
[im 59/102  bone]
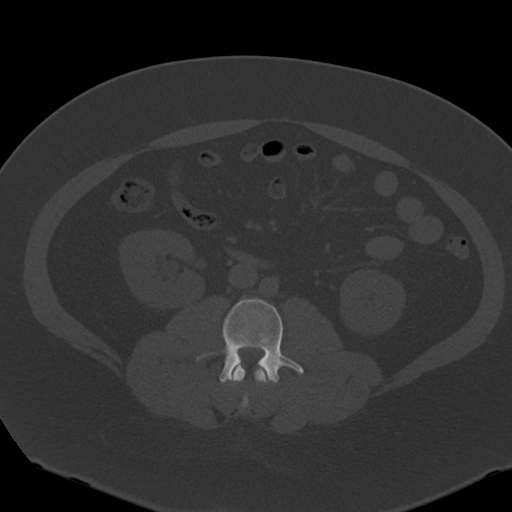
[im 68/102  soft-tissue]
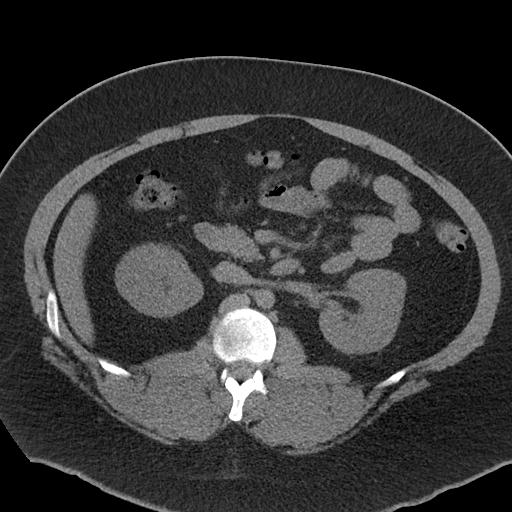
[im 76/102  soft-tissue]
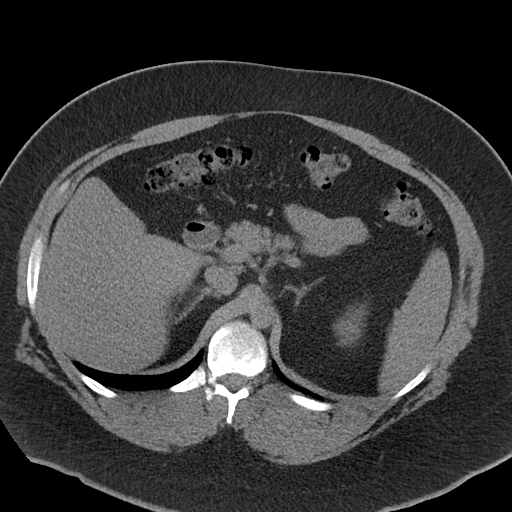
[im 80/102  soft-tissue]
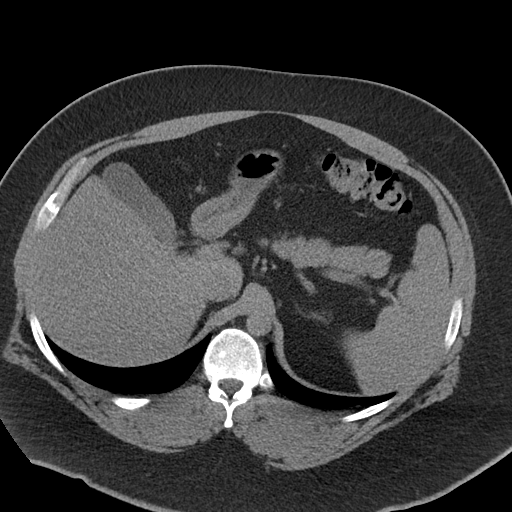
[im 89/102  soft-tissue]
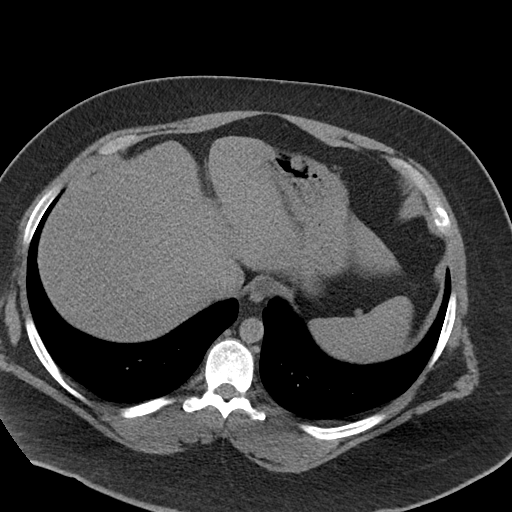
[im 97/102  soft-tissue]
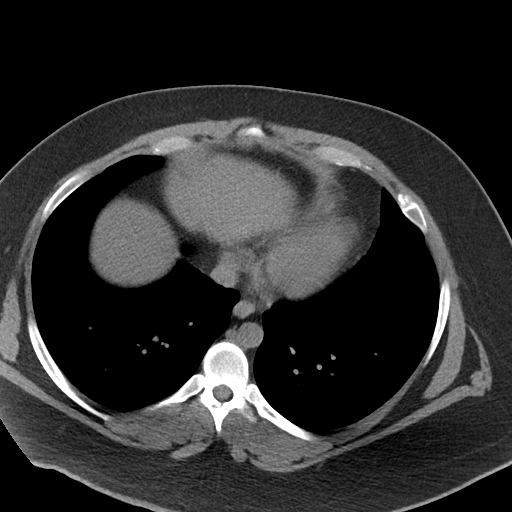

[Series 5: renal stone 3.0 cor · coronal · 0.92mm/px · 3 of 101 slices shown]
[im 34/101  soft-tissue]
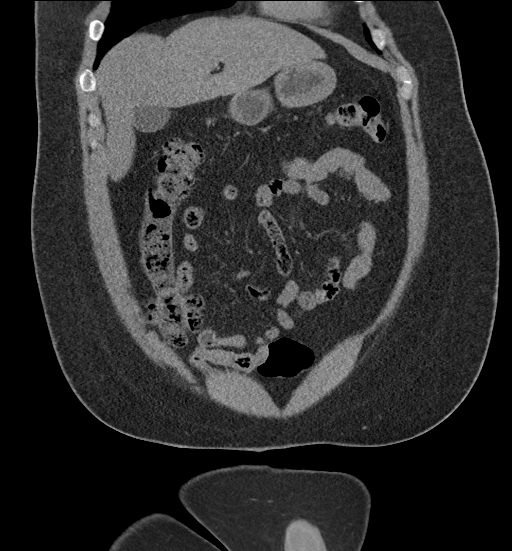
[im 45/101  soft-tissue]
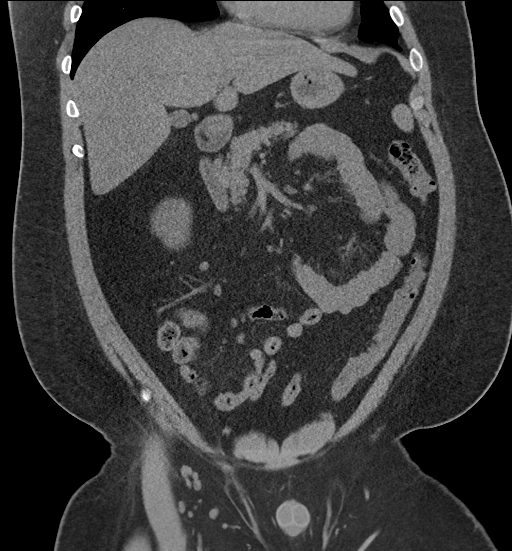
[im 56/101  soft-tissue]
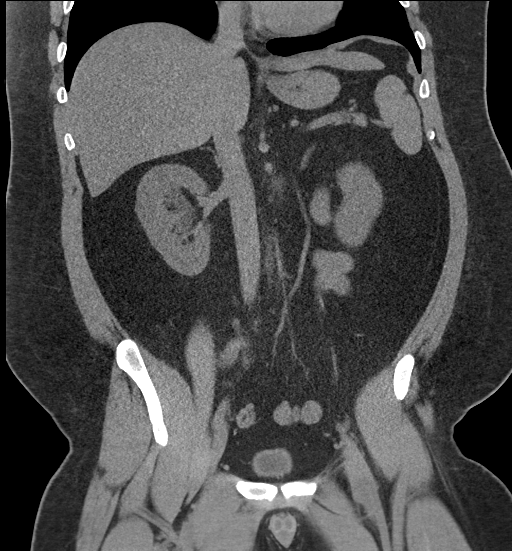

[17 of 46 positions shown; findings below may reference images not displayed]

FINDINGS: Lower chest: No acute abnormality.

Hepatobiliary: No focal liver abnormality is seen. No gallstones,
gallbladder wall thickening, or biliary dilatation.

Pancreas: Unremarkable. No pancreatic ductal dilatation or
surrounding inflammatory changes.

Spleen: Normal in size without focal abnormality.

Adrenals/Urinary Tract: 2 mm stone within the distal RIGHT ureter
causing mild RIGHT-sided hydronephrosis.

Stomach/Bowel: No dilated large or small bowel loops. No evidence of
bowel wall inflammation. Stomach is unremarkable, partially
decompressed.

Vascular/Lymphatic: No significant vascular findings are present. No
enlarged abdominal or pelvic lymph nodes.

Reproductive: Prostate is unremarkable.

Other: No free fluid or abscess collection. No free intraperitoneal
air.

Musculoskeletal: No acute or suspicious osseous finding.
IMPRESSION: 2 mm stone within the distal right ureter causing mild right-sided
hydronephrosis.

## 2020-06-29 DIAGNOSIS — F411 Generalized anxiety disorder: Secondary | ICD-10-CM | POA: Diagnosis not present

## 2020-07-05 DIAGNOSIS — R030 Elevated blood-pressure reading, without diagnosis of hypertension: Secondary | ICD-10-CM | POA: Diagnosis not present

## 2020-07-05 DIAGNOSIS — F419 Anxiety disorder, unspecified: Secondary | ICD-10-CM | POA: Diagnosis not present

## 2020-07-05 DIAGNOSIS — R002 Palpitations: Secondary | ICD-10-CM | POA: Diagnosis not present

## 2020-07-06 DIAGNOSIS — F411 Generalized anxiety disorder: Secondary | ICD-10-CM | POA: Diagnosis not present

## 2020-07-13 DIAGNOSIS — F411 Generalized anxiety disorder: Secondary | ICD-10-CM | POA: Diagnosis not present

## 2020-07-14 DIAGNOSIS — F411 Generalized anxiety disorder: Secondary | ICD-10-CM | POA: Diagnosis not present

## 2020-08-24 DIAGNOSIS — F411 Generalized anxiety disorder: Secondary | ICD-10-CM | POA: Diagnosis not present

## 2020-09-28 DIAGNOSIS — F411 Generalized anxiety disorder: Secondary | ICD-10-CM | POA: Diagnosis not present

## 2020-10-12 DIAGNOSIS — F411 Generalized anxiety disorder: Secondary | ICD-10-CM | POA: Diagnosis not present

## 2020-10-26 DIAGNOSIS — F411 Generalized anxiety disorder: Secondary | ICD-10-CM | POA: Diagnosis not present

## 2020-11-23 DIAGNOSIS — F411 Generalized anxiety disorder: Secondary | ICD-10-CM | POA: Diagnosis not present

## 2020-12-07 DIAGNOSIS — F411 Generalized anxiety disorder: Secondary | ICD-10-CM | POA: Diagnosis not present

## 2020-12-21 DIAGNOSIS — F411 Generalized anxiety disorder: Secondary | ICD-10-CM | POA: Diagnosis not present

## 2021-01-04 DIAGNOSIS — F411 Generalized anxiety disorder: Secondary | ICD-10-CM | POA: Diagnosis not present

## 2021-02-09 DIAGNOSIS — F411 Generalized anxiety disorder: Secondary | ICD-10-CM | POA: Diagnosis not present

## 2021-02-22 DIAGNOSIS — F411 Generalized anxiety disorder: Secondary | ICD-10-CM | POA: Diagnosis not present

## 2021-02-26 DIAGNOSIS — E65 Localized adiposity: Secondary | ICD-10-CM | POA: Diagnosis not present

## 2021-02-26 DIAGNOSIS — E119 Type 2 diabetes mellitus without complications: Secondary | ICD-10-CM | POA: Diagnosis not present

## 2021-02-26 DIAGNOSIS — E782 Mixed hyperlipidemia: Secondary | ICD-10-CM | POA: Diagnosis not present

## 2022-02-11 DIAGNOSIS — F908 Attention-deficit hyperactivity disorder, other type: Secondary | ICD-10-CM | POA: Diagnosis not present

## 2022-02-11 DIAGNOSIS — E1169 Type 2 diabetes mellitus with other specified complication: Secondary | ICD-10-CM | POA: Diagnosis not present

## 2022-02-11 DIAGNOSIS — K219 Gastro-esophageal reflux disease without esophagitis: Secondary | ICD-10-CM | POA: Diagnosis not present

## 2022-02-11 DIAGNOSIS — F419 Anxiety disorder, unspecified: Secondary | ICD-10-CM | POA: Diagnosis not present

## 2022-10-03 DIAGNOSIS — J029 Acute pharyngitis, unspecified: Secondary | ICD-10-CM | POA: Diagnosis not present

## 2022-10-24 DIAGNOSIS — Z113 Encounter for screening for infections with a predominantly sexual mode of transmission: Secondary | ICD-10-CM | POA: Diagnosis not present

## 2022-10-24 DIAGNOSIS — E782 Mixed hyperlipidemia: Secondary | ICD-10-CM | POA: Diagnosis not present

## 2022-10-24 DIAGNOSIS — H6692 Otitis media, unspecified, left ear: Secondary | ICD-10-CM | POA: Diagnosis not present

## 2022-10-24 DIAGNOSIS — E1169 Type 2 diabetes mellitus with other specified complication: Secondary | ICD-10-CM | POA: Diagnosis not present

## 2022-10-24 DIAGNOSIS — J358 Other chronic diseases of tonsils and adenoids: Secondary | ICD-10-CM | POA: Diagnosis not present

## 2022-12-13 DIAGNOSIS — R945 Abnormal results of liver function studies: Secondary | ICD-10-CM | POA: Diagnosis not present

## 2022-12-13 DIAGNOSIS — Z0000 Encounter for general adult medical examination without abnormal findings: Secondary | ICD-10-CM | POA: Diagnosis not present

## 2022-12-20 DIAGNOSIS — R945 Abnormal results of liver function studies: Secondary | ICD-10-CM | POA: Diagnosis not present

## 2023-02-28 DIAGNOSIS — U071 COVID-19: Secondary | ICD-10-CM | POA: Diagnosis not present
